# Patient Record
Sex: Female | Born: 1980 | Race: Black or African American | Hispanic: No | Smoking: Never smoker
Health system: Southern US, Community
[De-identification: ages and names within clinical notes are randomized; demographics above are authoritative.]

---

## 2015-12-10 ENCOUNTER — Emergency Department (HOSPITAL_COMMUNITY)
Admission: EM | Admit: 2015-12-10 | Discharge: 2015-12-10 | Disposition: A | Discharge status: Home / Self Care | Payer: No Typology Code available for payment source | Attending: Emergency Medicine | Admitting: Emergency Medicine

## 2015-12-10 ENCOUNTER — Encounter (HOSPITAL_COMMUNITY): Payer: Self-pay | Admitting: Emergency Medicine

## 2015-12-10 ENCOUNTER — Emergency Department (HOSPITAL_COMMUNITY): Payer: No Typology Code available for payment source

## 2015-12-10 DIAGNOSIS — S3992XA Unspecified injury of lower back, initial encounter: Secondary | ICD-10-CM | POA: Insufficient documentation

## 2015-12-10 DIAGNOSIS — Y998 Other external cause status: Secondary | ICD-10-CM | POA: Diagnosis not present

## 2015-12-10 DIAGNOSIS — Y9389 Activity, other specified: Secondary | ICD-10-CM | POA: Diagnosis not present

## 2015-12-10 DIAGNOSIS — S29001A Unspecified injury of muscle and tendon of front wall of thorax, initial encounter: Secondary | ICD-10-CM | POA: Diagnosis present

## 2015-12-10 DIAGNOSIS — Y92481 Parking lot as the place of occurrence of the external cause: Secondary | ICD-10-CM | POA: Insufficient documentation

## 2015-12-10 LAB — BASIC METABOLIC PANEL
ANION GAP: 10 (ref 5–15)
BUN: 9 mg/dL (ref 6–20)
CALCIUM: 9.4 mg/dL (ref 8.9–10.3)
CO2: 28 mmol/L (ref 22–32)
Chloride: 103 mmol/L (ref 101–111)
Creatinine, Ser: 1.09 mg/dL — ABNORMAL HIGH (ref 0.44–1.00)
Glucose, Bld: 89 mg/dL (ref 65–99)
POTASSIUM: 4.1 mmol/L (ref 3.5–5.1)
SODIUM: 141 mmol/L (ref 135–145)

## 2015-12-10 LAB — CBC
HEMATOCRIT: 39.2 % (ref 36.0–46.0)
HEMOGLOBIN: 11.9 g/dL — AB (ref 12.0–15.0)
MCH: 21 pg — ABNORMAL LOW (ref 26.0–34.0)
MCHC: 30.4 g/dL (ref 30.0–36.0)
MCV: 69.1 fL — ABNORMAL LOW (ref 78.0–100.0)
Platelets: 503 10*3/uL — ABNORMAL HIGH (ref 150–400)
RBC: 5.67 MIL/uL — ABNORMAL HIGH (ref 3.87–5.11)
RDW: 18 % — ABNORMAL HIGH (ref 11.5–15.5)
WBC: 9.3 10*3/uL (ref 4.0–10.5)

## 2015-12-10 LAB — I-STAT TROPONIN, ED: TROPONIN I, POC: 0 ng/mL (ref 0.00–0.08)

## 2015-12-10 MED ORDER — KETOROLAC TROMETHAMINE 60 MG/2ML IM SOLN
60.0000 mg | Freq: Once | INTRAMUSCULAR | Status: AC
  Administered 2015-12-10: 60 mg via INTRAMUSCULAR
  Filled 2015-12-10: qty 2

## 2015-12-10 MED ORDER — TRAMADOL HCL 50 MG PO TABS: 50.0000 mg | ORAL_TABLET | Freq: Two times a day (BID) | ORAL | Status: AC | PRN

## 2015-12-10 MED ORDER — HYDROCODONE-ACETAMINOPHEN 5-325 MG PO TABS
2.0000 | ORAL_TABLET | Freq: Once | ORAL | Status: AC
  Administered 2015-12-10: 2 via ORAL
  Filled 2015-12-10: qty 2
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
